# Patient Record
Sex: Male | Born: 1950 | Hispanic: No | Marital: Married | Smoking: Never smoker
Health system: Southern US, Community
[De-identification: ages and names within clinical notes are randomized; demographics above are authoritative.]

## PROBLEM LIST (undated history)

## (undated) HISTORY — PX: PROSTATE BIOPSY: SHX241

---

## 2017-11-10 ENCOUNTER — Other Ambulatory Visit: Payer: Self-pay

## 2017-11-10 ENCOUNTER — Emergency Department (HOSPITAL_BASED_OUTPATIENT_CLINIC_OR_DEPARTMENT_OTHER)
Admission: EM | Admit: 2017-11-10 | Discharge: 2017-11-10 | Disposition: A | Discharge status: Home / Self Care | Payer: Medicare Other | Attending: Emergency Medicine | Admitting: Emergency Medicine

## 2017-11-10 ENCOUNTER — Emergency Department (HOSPITAL_BASED_OUTPATIENT_CLINIC_OR_DEPARTMENT_OTHER): Payer: Medicare Other

## 2017-11-10 DIAGNOSIS — R339 Retention of urine, unspecified: Secondary | ICD-10-CM | POA: Insufficient documentation

## 2017-11-10 DIAGNOSIS — R102 Pelvic and perineal pain: Secondary | ICD-10-CM | POA: Diagnosis not present

## 2017-11-10 DIAGNOSIS — R300 Dysuria: Secondary | ICD-10-CM | POA: Diagnosis present

## 2017-11-10 LAB — URINALYSIS, ROUTINE W REFLEX MICROSCOPIC
BILIRUBIN URINE: NEGATIVE
Ketones, ur: NEGATIVE mg/dL
Nitrite: NEGATIVE
Protein, ur: NEGATIVE mg/dL
SPECIFIC GRAVITY, URINE: 1.015 (ref 1.005–1.030)
pH: 6 (ref 5.0–8.0)

## 2017-11-10 LAB — BASIC METABOLIC PANEL
ANION GAP: 10 (ref 5–15)
BUN: 15 mg/dL (ref 6–20)
CHLORIDE: 100 mmol/L — AB (ref 101–111)
CO2: 22 mmol/L (ref 22–32)
CREATININE: 1.07 mg/dL (ref 0.61–1.24)
Calcium: 8.5 mg/dL — ABNORMAL LOW (ref 8.9–10.3)
GFR calc non Af Amer: >60 mL/min (ref >60)
Glucose, Bld: 203 mg/dL — ABNORMAL HIGH (ref 65–99)
POTASSIUM: 4 mmol/L (ref 3.5–5.1)
SODIUM: 132 mmol/L — AB (ref 135–145)

## 2017-11-10 LAB — CBC WITH DIFFERENTIAL/PLATELET
BASOS ABS: 0 10*3/uL (ref 0.0–0.1)
Basophils Relative: 0 %
Eosinophils Absolute: 0.3 10*3/uL (ref 0.0–0.7)
Eosinophils Relative: 2 %
HCT: 39.7 % (ref 39.0–52.0)
HEMOGLOBIN: 14.3 g/dL (ref 13.0–17.0)
LYMPHS PCT: 10 %
Lymphs Abs: 1.5 10*3/uL (ref 0.7–4.0)
MCH: 30.3 pg (ref 26.0–34.0)
MCHC: 36 g/dL (ref 30.0–36.0)
MCV: 84.1 fL (ref 78.0–100.0)
Monocytes Absolute: 1.8 10*3/uL — ABNORMAL HIGH (ref 0.1–1.0)
Monocytes Relative: 12 %
NEUTROS ABS: 11.9 10*3/uL (ref 1.7–7.7)
Neutrophils Relative %: 76 %
Platelets: 275 10*3/uL (ref 150–400)
RBC: 4.72 MIL/uL (ref 4.22–5.81)
RDW: 11.9 % (ref 11.5–15.5)
WBC: 15.6 10*3/uL — AB (ref 4.0–10.5)

## 2017-11-10 LAB — URINALYSIS, MICROSCOPIC (REFLEX)

## 2017-11-10 MED ORDER — HYDROMORPHONE HCL 1 MG/ML IJ SOLN
1.0000 mg | Freq: Once | INTRAMUSCULAR | Status: AC
  Administered 2017-11-10: 1 mg via INTRAMUSCULAR
  Filled 2017-11-10: qty 1

## 2017-11-10 MED ORDER — CEPHALEXIN 500 MG PO CAPS: 500.0000 mg | ORAL_CAPSULE | Freq: Three times a day (TID) | ORAL | 0 refills | Status: AC

## 2017-11-10 NOTE — ED Notes (Signed)
Patient transported to CT 

## 2017-11-10 NOTE — ED Notes (Signed)
Bladder scan showed 

## 2017-11-10 NOTE — ED Triage Notes (Signed)
Pt reports 10/10 pelvis pain, penis pain, and dysuria. Pt had a prostate biopsy 4/17 and has had pain that has no subsided. Pt A+OX4.

## 2017-11-10 NOTE — Discharge Instructions (Addendum)
Keep the catheter in place until you see your urologist.  Take the antibiotics as prescribed.  Return to the ED if you develop worsening pain, fever, vomiting or any other concerns.  Follow-up with your doctor regarding your elevated blood sugar

## 2017-11-10 NOTE — ED Provider Notes (Signed)
[facility] EMERGENCY DEPARTMENT Provider Note   CSN: [id number] Arrival date & time: 11/10/17  4098     History   Chief Complaint Chief Complaint  Patient presents with  . Dysuria    HPI Lawrence Banks is a 67 y.o. male.  Patient very uncomfortable.  Reports severe pelvic pain, penis pain and pain with urination for the past 3 days.  He underwent a prostate biopsy on April 17 by Dr. Gerome Sam.  he states he was not having any urinary problems before this.  He feels like he cannot empty his bladder and has had frequency, urgency and hematuria with only a few drops of urine coming out.  No fever, no vomiting.  He was not given any pain medication at home.  Denies abdominal pain or back pain.  No change in bowel habits.  The history is provided by the patient and a relative.  Dysuria   Associated symptoms include frequency, hematuria and urgency. Pertinent negatives include no nausea, no vomiting and no flank pain.    No past medical history on file.  There are no active problems to display for this patient.   * The histories are not reviewed yet. Please review them in the "History" navigator section and refresh this SmartLink.      Home Medications    Prior to Admission medications   Not on File    Family History No family history on file.  Social History Social History   Tobacco Use  . Smoking status: Not on file  Substance Use Topics  . Alcohol use: Not on file  . Drug use: Not on file     Allergies   Patient has no known allergies.   Review of Systems Review of Systems  Constitutional: Negative for activity change, appetite change and fever.  Respiratory: Negative for cough, chest tightness and shortness of breath.   Cardiovascular: Negative for chest pain.  Gastrointestinal: Negative for abdominal pain, nausea and vomiting.  Genitourinary: Positive for difficulty urinating, dysuria, frequency, hematuria and urgency. Negative for flank  pain, penile pain, penile swelling and scrotal swelling.  Musculoskeletal: Negative for arthralgias and myalgias.  Skin: Negative for rash.  Neurological: Negative for dizziness, tremors and headaches.   all other systems are negative except as noted in the HPI and PMH.     Physical Exam Updated Vital Signs BP (!) 179/94 (BP Location: Right Arm)   Pulse 88   Temp 97.7 F (36.5 C) (Oral)   Resp 18   SpO2 96%   Physical Exam  Constitutional: He is oriented to person, place, and time. He appears well-developed and well-nourished. He appears distressed.  Uncomfortable, pacing around room  HENT:  Head: Normocephalic and atraumatic.  Mouth/Throat: Oropharynx is clear and moist. No oropharyngeal exudate.  Eyes: Pupils are equal, round, and reactive to light. Conjunctivae and EOM are normal.  Neck: Normal range of motion. Neck supple.  No meningismus.  Cardiovascular: Normal rate, regular rhythm, normal heart sounds and intact distal pulses.  No murmur heard. Pulmonary/Chest: Effort normal and breath sounds normal. No respiratory distress.  Abdominal: Soft. There is tenderness. There is no rebound and no guarding.  Suprapubic tenderness  Genitourinary:  Genitourinary Comments: No testicular tenderness.  No discharge from penis  Musculoskeletal: Normal range of motion. He exhibits no edema or tenderness.  Neurological: He is alert and oriented to person, place, and time. No cranial nerve deficit. He exhibits normal muscle tone. Coordination normal.  No ataxia on finger to nose  bilaterally. No pronator drift. 5/5 strength throughout. CN 2-12 intact.Equal grip strength. Sensation intact.   Skin: Skin is warm.  Psychiatric: He has a normal mood and affect. His behavior is normal.  Nursing note and vitals reviewed.    ED Treatments / Results  Labs (all labs ordered are listed, but only abnormal results are displayed) Labs Reviewed  URINALYSIS, ROUTINE W REFLEX MICROSCOPIC - Abnormal;  Notable for the following components:      Result Value   Glucose, UA >=500 (*)    Hgb urine dipstick LARGE (*)    Leukocytes, UA TRACE (*)    All other components within normal limits  URINALYSIS, MICROSCOPIC (REFLEX) - Abnormal; Notable for the following components:   Bacteria, UA FEW (*)    Squamous Epithelial / LPF 0-5 (*)    All other components within normal limits  CBC WITH DIFFERENTIAL/PLATELET - Abnormal; Notable for the following components:   WBC 15.6 (*)    Monocytes Absolute 1.8 (*)    All other components within normal limits  BASIC METABOLIC PANEL - Abnormal; Notable for the following components:   Sodium 132 (*)    Chloride 100 (*)    Glucose, Bld 203 (*)    Calcium 8.5 (*)    All other components within normal limits  URINE CULTURE    EKG None  Radiology Ct Renal Stone Study  Result Date: 11/10/2017 CLINICAL DATA:  Acute onset of pelvic and penile pain. Dysuria and hematuria. Recent prostate biopsy. EXAM: CT ABDOMEN AND PELVIS WITHOUT CONTRAST TECHNIQUE: Multidetector CT imaging of the abdomen and pelvis was performed following the standard protocol without IV contrast. COMPARISON:  Abdominal radiographs performed 10/30/2017 FINDINGS: Lower chest: The visualized lung bases are grossly clear. The visualized portions of the mediastinum are unremarkable. Hepatobiliary: The liver is unremarkable in appearance. The gallbladder is unremarkable in appearance. The common bile duct remains normal in caliber. Pancreas: The pancreas is within normal limits. Spleen: The spleen is unremarkable in appearance. Adrenals/Urinary Tract: The adrenal glands are unremarkable in appearance. The kidneys are within normal limits. There is no evidence of hydronephrosis. No renal or ureteral stones are identified. No perinephric stranding is seen. Stomach/Bowel: The stomach is unremarkable in appearance. The small bowel is within normal limits. The appendix is normal in caliber, without evidence  of appendicitis. The colon is unremarkable in appearance. Vascular/Lymphatic: Minimal calcification is seen along the abdominal aorta and its branches. The abdominal aorta is otherwise grossly unremarkable. The inferior vena cava is grossly unremarkable. No retroperitoneal lymphadenopathy is seen. No pelvic sidewall lymphadenopathy is identified. Reproductive: The bladder is decompressed, with a Foley catheter in place. Mild wall thickening is thought to reflect relative decompression. The prostate is enlarged, measuring 5.6 cm in transverse dimension. Other: No additional soft tissue abnormalities are seen. Musculoskeletal: No acute osseous abnormalities are identified. The visualized musculature is unremarkable in appearance. IMPRESSION: 1. No acute abnormality seen to explain the patient's symptoms. 2. Enlarged prostate noted. 3. Mild bladder wall thickening is thought to reflect relative decompression. Electronically Signed   By: Roanna RaiderJeffery  Chang M.D.   On: 11/10/2017 05:26    Procedures Procedures (including critical care time)  Medications Ordered in ED Medications  HYDROmorphone (DILAUDID) injection 1 mg (has no administration in time range)     Initial Impression / Assessment and Plan / ED Course  I have reviewed the triage vital signs and the nursing notes.  Pertinent labs & imaging results that were available during my care of the  patient were reviewed by me and considered in my medical decision making (see chart for details).    Patient with suprapubic pain, penis pain, dysuria, hematuria, frequency and dribbling since prostate biopsy 3 days ago.  Bladder scan shows 461 mL. Patient only able to void a few drops. Foley catheter will be placed.  Patient feels much improved with catheter in place.  Greater than 600 cc of clear urine.  Blood pressure has improved.  Labs are reassuring with mild leukocytosis and hyperglycemia.  Urinalysis will be sent for culture. Will cover with  prophylactic antibiotics given recent procedure and leukocytosis.  Advised patient to follow-up with PCP regarding hyperglycemia.  Foley catheter will be remaining in place until follow-up with his urologist.  Return precautions discussed.  BP 132/69 (BP Location: Right Arm)   Pulse 77   Temp 97.7 F (36.5 C) (Oral)   Resp 16   SpO2 95%    Final Clinical Impressions(s) / ED Diagnoses   Final diagnoses:  Urinary retention    ED Discharge Orders    None       Derry Arbogast, Jeannett Senior, MD 11/10/17 [phone]

## 2017-11-11 ENCOUNTER — Emergency Department (HOSPITAL_BASED_OUTPATIENT_CLINIC_OR_DEPARTMENT_OTHER)
Admission: EM | Admit: 2017-11-11 | Discharge: 2017-11-11 | Disposition: A | Discharge status: Home / Self Care | Payer: Medicare Other | Attending: Emergency Medicine | Admitting: Emergency Medicine

## 2017-11-11 ENCOUNTER — Encounter (HOSPITAL_BASED_OUTPATIENT_CLINIC_OR_DEPARTMENT_OTHER): Payer: Self-pay

## 2017-11-11 ENCOUNTER — Other Ambulatory Visit: Payer: Self-pay

## 2017-11-11 DIAGNOSIS — N3289 Other specified disorders of bladder: Secondary | ICD-10-CM | POA: Diagnosis not present

## 2017-11-11 DIAGNOSIS — Y731 Therapeutic (nonsurgical) and rehabilitative gastroenterology and urology devices associated with adverse incidents: Secondary | ICD-10-CM | POA: Insufficient documentation

## 2017-11-11 DIAGNOSIS — T839XXA Unspecified complication of genitourinary prosthetic device, implant and graft, initial encounter: Secondary | ICD-10-CM | POA: Insufficient documentation

## 2017-11-11 LAB — URINE CULTURE

## 2017-11-11 MED ORDER — OXYBUTYNIN CHLORIDE 5 MG PO TABS
5.0000 mg | ORAL_TABLET | Freq: Once | ORAL | Status: DC
  Filled 2017-11-11: qty 1

## 2017-11-11 MED ORDER — OXYBUTYNIN CHLORIDE 5 MG PO TABS: 5.0000 mg | ORAL_TABLET | Freq: Three times a day (TID) | ORAL | 0 refills | Status: AC

## 2017-11-11 NOTE — ED Provider Notes (Signed)
[facility] EMERGENCY DEPARTMENT Provider Note   CSN: [id number] Arrival date & time: 11/11/17  1607     History   Chief Complaint No chief complaint on file.   HPI Lawrence Banks is a 67 y.o. male.  67 yo M with a chief complaint of feeling like he needs to urinate and he has to bear down to get urine to come out of his catheter.  This been going on for the past couple days.  The patient was recently here after not being able to urinate after having a prostate biopsy.  Since then the patient is noticed some blood in the bag and feels an urgency that he needs to urinate.  He has had some small amount of leakage at the head of the penis.  Denies fevers or chills denies flank pain.  The history is provided by the patient.  Illness  This is a new problem. The current episode started 2 days ago. The problem occurs constantly. The problem has not changed since onset.Pertinent negatives include no chest pain, no abdominal pain, no headaches and no shortness of breath. Nothing aggravates the symptoms. Nothing relieves the symptoms. He has tried nothing for the symptoms. The treatment provided no relief.    History reviewed. No pertinent past medical history.  There are no active problems to display for this patient.   History reviewed. No pertinent surgical history.      Home Medications    Prior to Admission medications   Medication Sig Start Date End Date Taking? Authorizing Provider  cephALEXin (KEFLEX) 500 MG capsule Take 1 capsule (500 mg total) by mouth 3 (three) times daily. 11/10/17   Rancour, Jeannett Senior, MD  oxybutynin (DITROPAN) 5 MG tablet Take 1 tablet (5 mg total) by mouth 3 (three) times daily. 11/11/17   Melene Plan, DO    Family History No family history on file.  Social History Social History   Tobacco Use  . Smoking status: Never Smoker  . Smokeless tobacco: Never Used  Substance Use Topics  . Alcohol use: Never    Frequency: Never  . Drug use: Never      Allergies   Patient has no known allergies.   Review of Systems Review of Systems  Constitutional: Negative for chills and fever.  HENT: Negative for congestion and facial swelling.   Eyes: Negative for discharge and visual disturbance.  Respiratory: Negative for shortness of breath.   Cardiovascular: Negative for chest pain and palpitations.  Gastrointestinal: Negative for abdominal pain, diarrhea and vomiting.  Genitourinary: Positive for urgency.  Musculoskeletal: Negative for arthralgias and myalgias.  Skin: Negative for color change and rash.  Neurological: Negative for tremors, syncope and headaches.  Psychiatric/Behavioral: Negative for confusion and dysphoric mood.     Physical Exam Updated Vital Signs BP 139/75 (BP Location: Right Arm)   Pulse 91   Temp 98 F (36.7 C) (Oral)   Resp 19   Ht 5\' 6"  (1.676 m)   Wt 72.6 kg (160 lb)   SpO2 98%   BMI 25.82 kg/m   Physical Exam  Constitutional: He is oriented to person, place, and time. He appears well-developed and well-nourished.  HENT:  Head: Normocephalic and atraumatic.  Eyes: Pupils are equal, round, and reactive to light. EOM are normal.  Neck: Normal range of motion. Neck supple. No JVD present.  Cardiovascular: Normal rate and regular rhythm. Exam reveals no gallop and no friction rub.  No murmur heard. Pulmonary/Chest: No respiratory distress. He has no  wheezes.  Abdominal: He exhibits no distension and no mass. There is no tenderness. There is no rebound and no guarding.  Genitourinary: Penis normal.  Genitourinary Comments: Foley catheter is in place.  No suprapubic tenderness or fullness no noted blood in the catheter bag.  No leaking from any connection or from the head of the penis  Musculoskeletal: Normal range of motion.  Neurological: He is alert and oriented to person, place, and time.  Skin: No rash noted. No pallor.  Psychiatric: He has a normal mood and affect. His behavior is normal.    Nursing note and vitals reviewed.    ED Treatments / Results  Labs (all labs ordered are listed, but only abnormal results are displayed) Labs Reviewed - No data to display  EKG None  Radiology Ct Renal Stone Study  Result Date: 11/10/2017 CLINICAL DATA:  Acute onset of pelvic and penile pain. Dysuria and hematuria. Recent prostate biopsy. EXAM: CT ABDOMEN AND PELVIS WITHOUT CONTRAST TECHNIQUE: Multidetector CT imaging of the abdomen and pelvis was performed following the standard protocol without IV contrast. COMPARISON:  Abdominal radiographs performed 10/30/2017 FINDINGS: Lower chest: The visualized lung bases are grossly clear. The visualized portions of the mediastinum are unremarkable. Hepatobiliary: The liver is unremarkable in appearance. The gallbladder is unremarkable in appearance. The common bile duct remains normal in caliber. Pancreas: The pancreas is within normal limits. Spleen: The spleen is unremarkable in appearance. Adrenals/Urinary Tract: The adrenal glands are unremarkable in appearance. The kidneys are within normal limits. There is no evidence of hydronephrosis. No renal or ureteral stones are identified. No perinephric stranding is seen. Stomach/Bowel: The stomach is unremarkable in appearance. The small bowel is within normal limits. The appendix is normal in caliber, without evidence of appendicitis. The colon is unremarkable in appearance. Vascular/Lymphatic: Minimal calcification is seen along the abdominal aorta and its branches. The abdominal aorta is otherwise grossly unremarkable. The inferior vena cava is grossly unremarkable. No retroperitoneal lymphadenopathy is seen. No pelvic sidewall lymphadenopathy is identified. Reproductive: The bladder is decompressed, with a Foley catheter in place. Mild wall thickening is thought to reflect relative decompression. The prostate is enlarged, measuring 5.6 cm in transverse dimension. Other: No additional soft tissue  abnormalities are seen. Musculoskeletal: No acute osseous abnormalities are identified. The visualized musculature is unremarkable in appearance. IMPRESSION: 1. No acute abnormality seen to explain the patient's symptoms. 2. Enlarged prostate noted. 3. Mild bladder wall thickening is thought to reflect relative decompression. Electronically Signed   By: Roanna Raider M.D.   On: 11/10/2017 05:26    Procedures Procedures (including critical care time)  Medications Ordered in ED Medications  oxybutynin (DITROPAN) tablet 5 mg (has no administration in time range)     Initial Impression / Assessment and Plan / ED Course  I have reviewed the triage vital signs and the nursing notes.  Pertinent labs & imaging results that were available during my care of the patient were reviewed by me and considered in my medical decision making (see chart for details).     67 yo  M with a chief complaint of pain at his bladder and a sensation that he needs to urinate.  I believe this is bladder spasms based on history.  Bladder scan with less than 30 cc in his bladder.  Irrigated with no issue.  Patient draining clear yellow urine.  Will discharge home on oxybutynin.  Urology follow-up.  5:40 PM:  I have discussed the diagnosis/risks/treatment options with the patient and  family and believe the pt to be eligible for discharge home to follow-up with Urology. We also discussed returning to the ED immediately if new or worsening sx occur. We discussed the sx which are most concerning (e.g., sudden worsening pain, fever, inability to tolerate by mouth) that necessitate immediate return. Medications administered to the patient during their visit and any new prescriptions provided to the patient are listed below.  Medications given during this visit Medications  oxybutynin (DITROPAN) tablet 5 mg (5 mg Oral Not Given 11/11/17 1738)     Old records reviewed recently seen in the ED for urinary retention with catheter  placed. CT negative for other issues.   The patient appears reasonably screen and/or stabilized for discharge and I doubt any other medical condition or other [facility] requiring further screening, evaluation, or treatment in the ED at this time prior to discharge.    Final Clinical Impressions(s) / ED Diagnoses   Final diagnoses:  Complication of Foley catheter, initial encounter [facility])  Bladder spasm    ED Discharge Orders        Ordered    oxybutynin (DITROPAN) 5 MG tablet  3 times daily     11/11/17 1733       Melene PlanFloyd, Edee Nifong, DO 11/11/17 1742

## 2017-11-11 NOTE — ED Triage Notes (Signed)
Pt had catheter placed in ED Friday PM. Reports it is now leaking with blood in urine.

## 2017-11-26 ENCOUNTER — Emergency Department (HOSPITAL_BASED_OUTPATIENT_CLINIC_OR_DEPARTMENT_OTHER)
Admission: EM | Admit: 2017-11-26 | Discharge: 2017-11-26 | Disposition: A | Discharge status: Home / Self Care | Payer: Medicare Other | Attending: Emergency Medicine | Admitting: Emergency Medicine

## 2017-11-26 ENCOUNTER — Encounter (HOSPITAL_BASED_OUTPATIENT_CLINIC_OR_DEPARTMENT_OTHER): Payer: Self-pay | Admitting: Emergency Medicine

## 2017-11-26 ENCOUNTER — Other Ambulatory Visit: Payer: Self-pay

## 2017-11-26 DIAGNOSIS — Z466 Encounter for fitting and adjustment of urinary device: Secondary | ICD-10-CM | POA: Insufficient documentation

## 2017-11-26 DIAGNOSIS — Y828 Other medical devices associated with adverse incidents: Secondary | ICD-10-CM | POA: Diagnosis not present

## 2017-11-26 DIAGNOSIS — Z79899 Other long term (current) drug therapy: Secondary | ICD-10-CM | POA: Diagnosis not present

## 2017-11-26 DIAGNOSIS — T839XXA Unspecified complication of genitourinary prosthetic device, implant and graft, initial encounter: Secondary | ICD-10-CM | POA: Insufficient documentation

## 2017-11-26 DIAGNOSIS — R82998 Other abnormal findings in urine: Secondary | ICD-10-CM | POA: Diagnosis not present

## 2017-11-26 LAB — CBC WITH DIFFERENTIAL/PLATELET
BASOS ABS: 0 10*3/uL (ref 0.0–0.1)
Basophils Relative: 0 %
EOS ABS: 0.2 10*3/uL (ref 0.0–0.7)
EOS PCT: 2 %
HCT: 40.7 % (ref 39.0–52.0)
Hemoglobin: 14.7 g/dL (ref 13.0–17.0)
Lymphocytes Relative: 11 %
Lymphs Abs: 1.2 10*3/uL (ref 0.7–4.0)
MCH: 30.4 pg (ref 26.0–34.0)
MCHC: 36.1 g/dL — ABNORMAL HIGH (ref 30.0–36.0)
MCV: 84.3 fL (ref 78.0–100.0)
Monocytes Absolute: 0.9 10*3/uL (ref 0.1–1.0)
Monocytes Relative: 8 %
Neutro Abs: 8.4 10*3/uL — ABNORMAL HIGH (ref 1.7–7.7)
Neutrophils Relative %: 79 %
PLATELETS: 299 10*3/uL (ref 150–400)
RBC: 4.83 MIL/uL (ref 4.22–5.81)
RDW: 12.6 % (ref 11.5–15.5)
WBC: 10.6 10*3/uL — AB (ref 4.0–10.5)

## 2017-11-26 LAB — URINALYSIS, ROUTINE W REFLEX MICROSCOPIC
Bilirubin Urine: NEGATIVE
Glucose, UA: 250 mg/dL — AB
KETONES UR: NEGATIVE mg/dL
Nitrite: NEGATIVE
Protein, ur: NEGATIVE mg/dL
Specific Gravity, Urine: 1.02 (ref 1.005–1.030)
pH: 5.5 (ref 5.0–8.0)

## 2017-11-26 LAB — URINALYSIS, MICROSCOPIC (REFLEX)

## 2017-11-26 LAB — BASIC METABOLIC PANEL
ANION GAP: 10 (ref 5–15)
BUN: 17 mg/dL (ref 6–20)
CALCIUM: 9 mg/dL (ref 8.9–10.3)
CO2: 24 mmol/L (ref 22–32)
Chloride: 100 mmol/L — ABNORMAL LOW (ref 101–111)
Creatinine, Ser: 1.2 mg/dL (ref 0.61–1.24)
GFR calc Af Amer: >60 mL/min (ref >60)
GFR calc non Af Amer: >60 mL/min (ref >60)
Glucose, Bld: 219 mg/dL — ABNORMAL HIGH (ref 65–99)
POTASSIUM: 4.2 mmol/L (ref 3.5–5.1)
SODIUM: 134 mmol/L — AB (ref 135–145)

## 2017-11-26 NOTE — ED Provider Notes (Signed)
[facility] EMERGENCY DEPARTMENT Provider Note   CSN: [id number] Arrival date & time: 11/26/17  0553     History   Chief Complaint Chief Complaint  Patient presents with  . clogged catheter    HPI Lawrence Banks is a 67 y.o. male.  67 year old male with a history of BPH and indwelling Foley catheter presents with decreased drainage.  Has been having leakage of urine around his catheter and significant lower abdominal pain for approximately last 24 hours.  Has had some blood in his urine is currently on ciprofloxacin.  Not have any back pain or lower extremity weakness.  No fevers.     History reviewed. No pertinent past medical history.  There are no active problems to display for this patient.   Past Surgical History:  Procedure Laterality Date  . PROSTATE BIOPSY          Home Medications    Prior to Admission medications   Medication Sig Start Date End Date Taking? Authorizing Provider  ciprofloxacin (CIPRO) 500 MG tablet Take 500 mg by mouth 2 (two) times daily.   Yes [provider]  tamsulosin (FLOMAX) 0.4 MG CAPS capsule Take 0.4 mg by mouth.   Yes [provider]  cephALEXin (KEFLEX) 500 MG capsule Take 1 capsule (500 mg total) by mouth 3 (three) times daily. 11/10/17   Rancour, Jeannett Senior, MD  oxybutynin (DITROPAN) 5 MG tablet Take 1 tablet (5 mg total) by mouth 3 (three) times daily. 11/11/17   Melene Plan, DO    Family History No family history on file.  Social History Social History   Tobacco Use  . Smoking status: Never Smoker  . Smokeless tobacco: Never Used  Substance Use Topics  . Alcohol use: Never    Frequency: Never  . Drug use: Never     Allergies   Patient has no known allergies.   Review of Systems Review of Systems  All other systems reviewed and are negative.    Physical Exam Updated Vital Signs BP (!) 153/86 (BP Location: Right Arm)   Pulse 82   Temp 98.1 F (36.7 C) (Oral)   Resp 20   Ht   (1.651 m)   Wt 72.6 kg (160 lb)   SpO2 98%   BMI 26.63 kg/m   Physical Exam  Constitutional: He is oriented to person, place, and time. He appears well-developed and well-nourished.  HENT:  Head: Normocephalic and atraumatic.  Eyes: Conjunctivae and EOM are normal.  Neck: Normal range of motion.  Cardiovascular: Normal rate.  Pulmonary/Chest: Effort normal. No respiratory distress.  Abdominal: Soft. He exhibits no distension. There is no tenderness. There is no guarding.  Musculoskeletal: Normal range of motion. He exhibits no edema or deformity.  Neurological: He is alert and oriented to person, place, and time. No cranial nerve deficit. Coordination normal.  Skin: Skin is warm and dry.  Nursing note and vitals reviewed.    ED Treatments / Results  Labs (all labs ordered are listed, but only abnormal results are displayed) Labs Reviewed  CBC WITH DIFFERENTIAL/PLATELET - Abnormal; Notable for the following components:      Result Value   WBC 10.6 (*)    MCHC 36.1 (*)    Neutro Abs 8.4 (*)    All other components within normal limits  URINALYSIS, ROUTINE W REFLEX MICROSCOPIC - Abnormal; Notable for the following components:   APPearance CLOUDY (*)    Glucose, UA 250 (*)    Hgb urine dipstick LARGE (*)  Leukocytes, UA TRACE (*)    All other components within normal limits  URINALYSIS, MICROSCOPIC (REFLEX) - Abnormal; Notable for the following components:   Bacteria, UA FEW (*)    All other components within normal limits  URINE CULTURE  BASIC METABOLIC PANEL    EKG None  Radiology No results found.  Procedures Procedures (including critical care time)  Medications Ordered in ED Medications - No data to display   Initial Impression / Assessment and Plan / ED Course  I have reviewed the triage vital signs and the nursing notes.  Pertinent labs & imaging results that were available during my care of the patient were reviewed by me and considered in my medical  decision making (see chart for details).     Catheter exchange prior to my evaluation and patient feels better.  There was blood clot in the catheter portion.  At this time he has cloudy urine does appear to be clear.  We will check for infection and make sure the Cipro Floxin is working however if this is normal patient be discharged follow-up with his urologist.  Low suspicion for bacterial infection. Culture sent. Stable for discharge.   Final Clinical Impressions(s) / ED Diagnoses   Final diagnoses:  Problem with Foley catheter, initial encounter [facility])    ED Discharge Orders    None       Blessings Inglett, Barbara Cower, MD 11/26/17 [phone]

## 2017-11-26 NOTE — ED Notes (Signed)
Pt requesting leg bag to go home.  Catheter bag switched to leg bag, keeping catheter and leg bag connection sterile.  Capped large catheter bag and sent home with family.

## 2017-11-26 NOTE — ED Triage Notes (Signed)
Pt states he had a foley catheter placed 11/11/17. States he has not had any drainage into bag since yesterday am, only leakage from around penis. Pt moaning, and anxious.

## 2017-11-27 LAB — URINE CULTURE

## 2020-03-10 IMAGING — CT CT RENAL STONE PROTOCOL
2 of 4 series · 16 of 46 positions shown, 18 images · non-contrast
Comparison: Abdominal radiographs performed 10/30/2017

CLINICAL DATA: Acute onset of pelvic and penile pain. Dysuria and
hematuria. Recent prostate biopsy.

EXAM:
CT ABDOMEN AND PELVIS WITHOUT CONTRAST
TECHNIQUE: Multidetector CT imaging of the abdomen and pelvis was performed
following the standard protocol without IV contrast.

[Series 2: axial st · axial · 0.77mm/px · z∈[+787,+1232]mm · 13 of 99 slices shown, 15 images]
[im 5/99  soft-tissue]
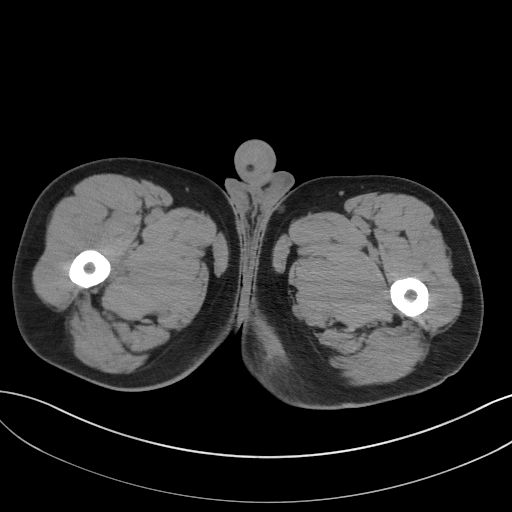
[im 5/99  bone]
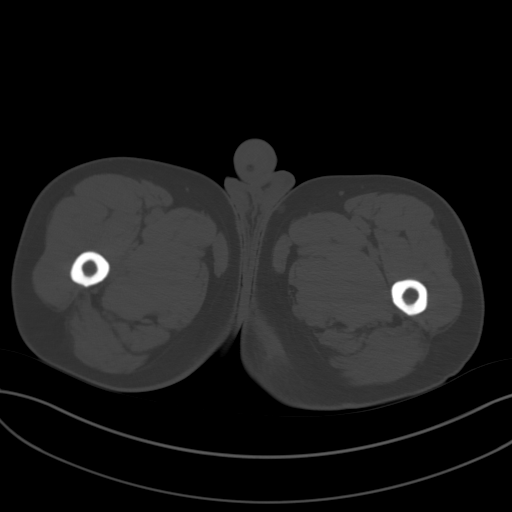
[im 13/99  soft-tissue]
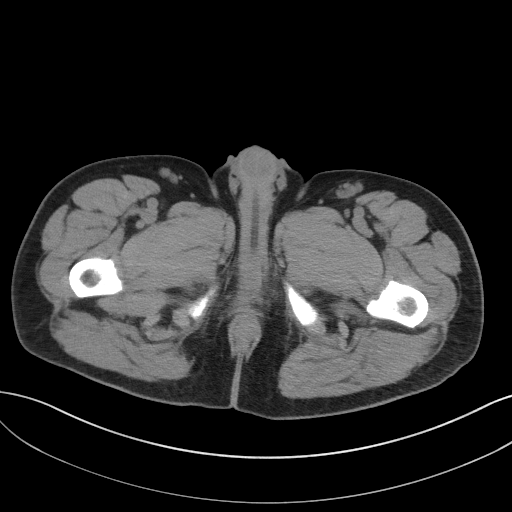
[im 22/99  soft-tissue]
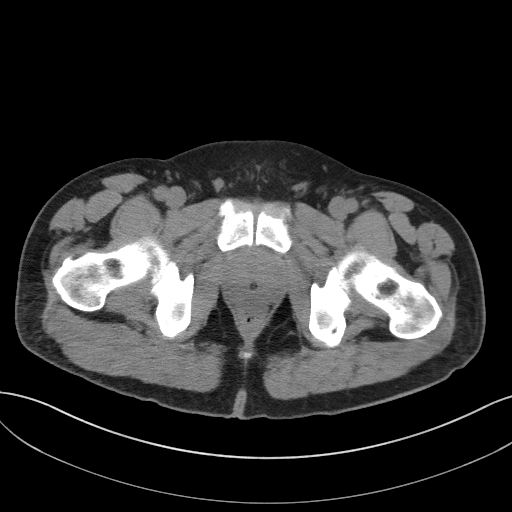
[im 26/99  soft-tissue]
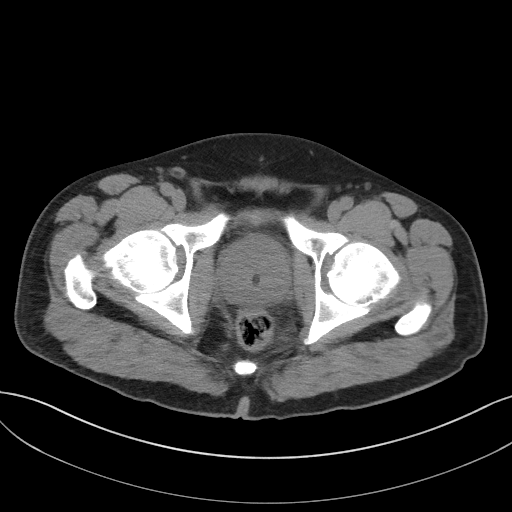
[im 35/99  soft-tissue]
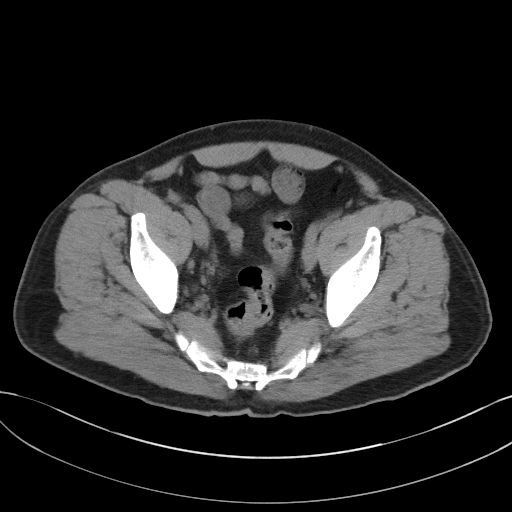
[im 43/99  soft-tissue]
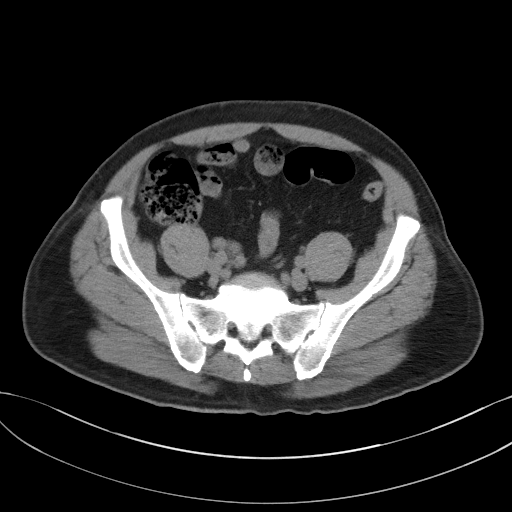
[im 52/99  soft-tissue]
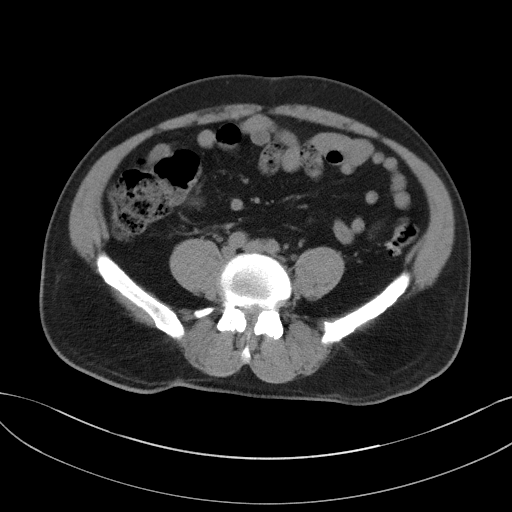
[im 56/99  soft-tissue]
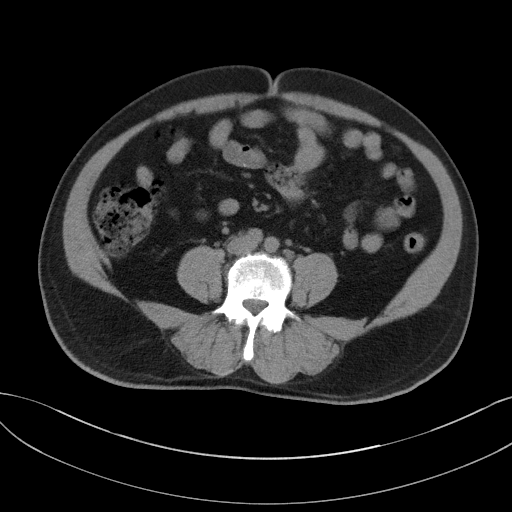
[im 64/99  soft-tissue]
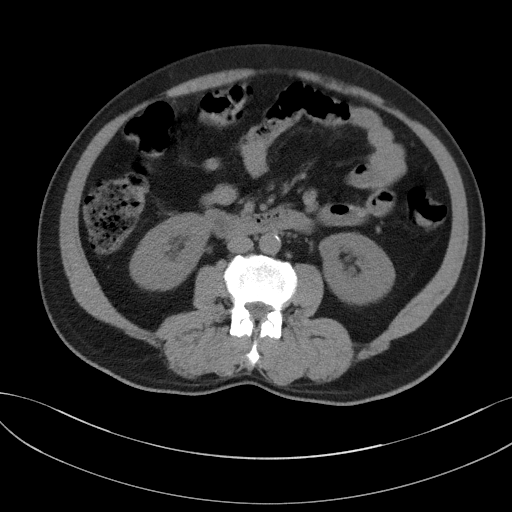
[im 64/99  bone]
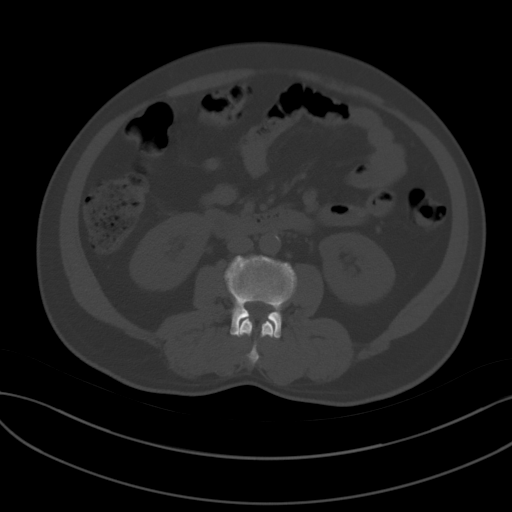
[im 73/99  soft-tissue]
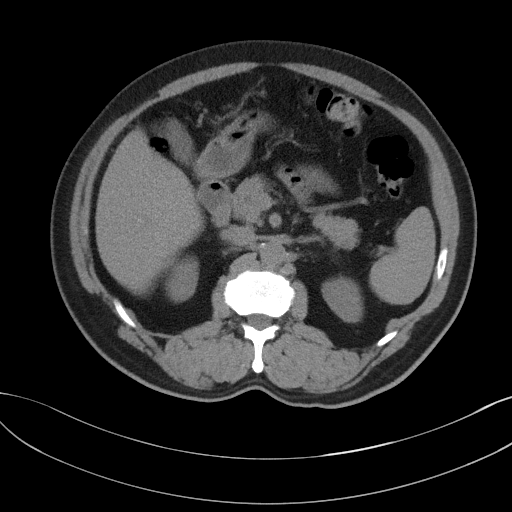
[im 77/99  soft-tissue]
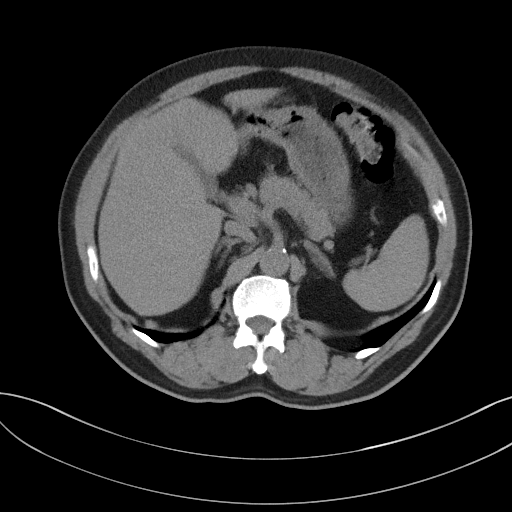
[im 86/99  soft-tissue]
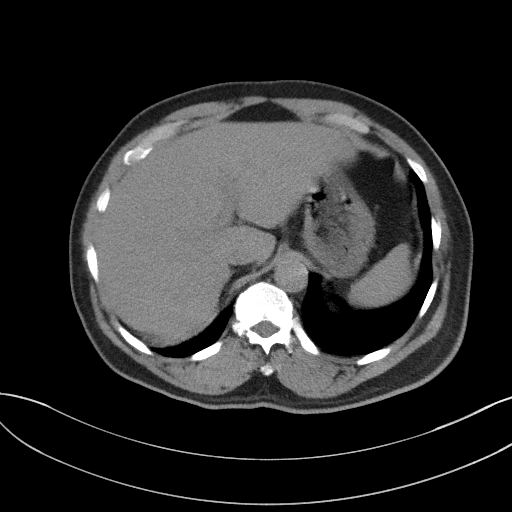
[im 94/99  soft-tissue]
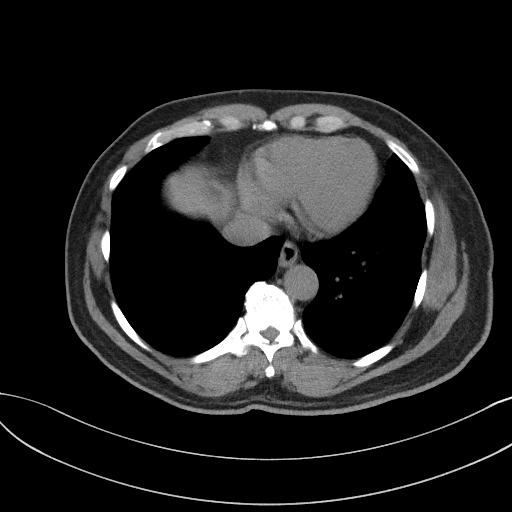

[Series 5: coronal st · coronal · 0.76mm/px · 3 of 101 slices shown]
[im 34/101  soft-tissue]
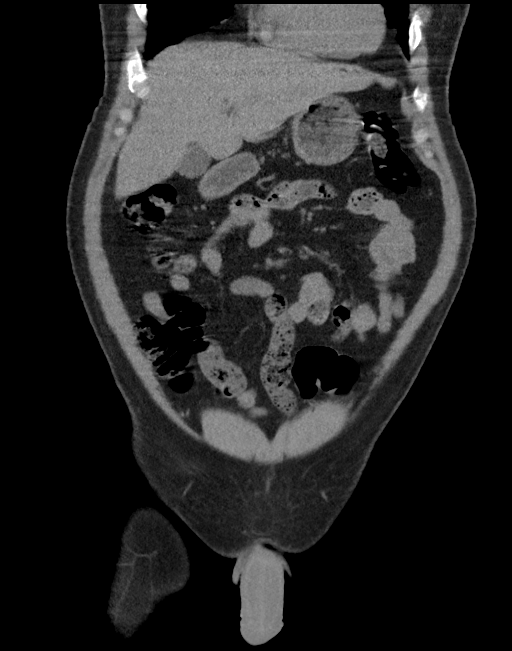
[im 45/101  soft-tissue]
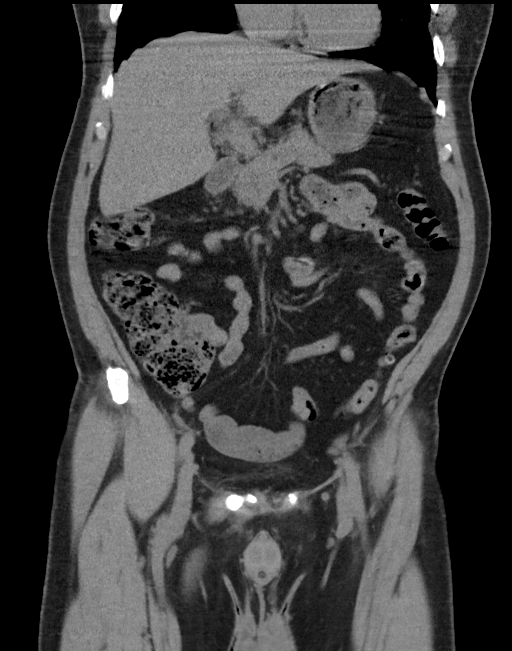
[im 56/101  soft-tissue]
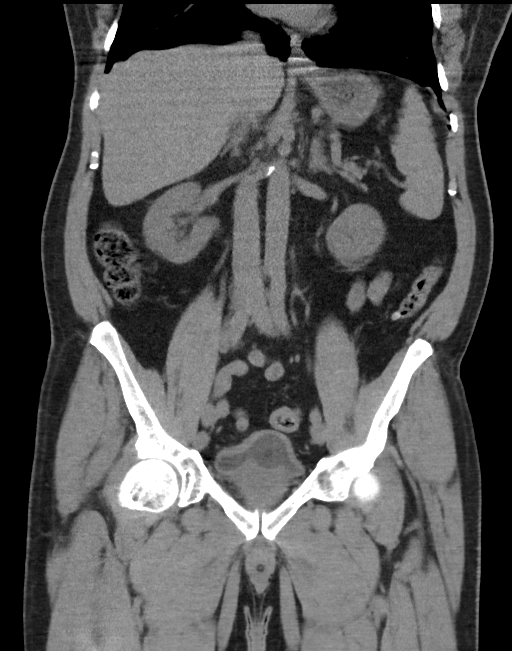

[16 of 46 positions shown; findings below may reference images not displayed]

FINDINGS: Lower chest: The visualized lung bases are grossly clear. The
visualized portions of the mediastinum are unremarkable.

Hepatobiliary: The liver is unremarkable in appearance. The
gallbladder is unremarkable in appearance. The common bile duct
remains normal in caliber.

Pancreas: The pancreas is within normal limits.

Spleen: The spleen is unremarkable in appearance.

Adrenals/Urinary Tract: The adrenal glands are unremarkable in
appearance. The kidneys are within normal limits. There is no
evidence of hydronephrosis. No renal or ureteral stones are
identified. No perinephric stranding is seen.

Stomach/Bowel: The stomach is unremarkable in appearance. The small
bowel is within normal limits. The appendix is normal in caliber,
without evidence of appendicitis. The colon is unremarkable in
appearance.

Vascular/Lymphatic: Minimal calcification is seen along the
abdominal aorta and its branches. The abdominal aorta is otherwise
grossly unremarkable. The inferior vena cava is grossly
unremarkable. No retroperitoneal lymphadenopathy is seen. No pelvic
sidewall lymphadenopathy is identified.

Reproductive: The bladder is decompressed, with a Foley catheter in
place. Mild wall thickening is thought to reflect relative
decompression. The prostate is enlarged, measuring 5.6 cm in
transverse dimension.

Other: No additional soft tissue abnormalities are seen.

Musculoskeletal: No acute osseous abnormalities are identified. The
visualized musculature is unremarkable in appearance.
IMPRESSION: 1. No acute abnormality seen to explain the patient's symptoms.
2. Enlarged prostate noted.
3. Mild bladder wall thickening is thought to reflect relative
decompression.
